# Patient Record
Sex: Female | Born: 1945 | Race: White | Hispanic: No
Health system: Southern US, Community
[De-identification: ages and names within clinical notes are randomized; demographics above are authoritative.]

---

## 2007-11-04 ENCOUNTER — Encounter: Admission: RE | Admit: 2007-11-04 | Discharge: 2007-11-04 | Payer: Self-pay | Admitting: Neurosurgery

## 2008-01-10 ENCOUNTER — Inpatient Hospital Stay (HOSPITAL_COMMUNITY): Admission: RE | Admit: 2008-01-10 | Discharge: 2008-01-19 | Payer: Self-pay | Admitting: Neurosurgery

## 2008-01-13 ENCOUNTER — Ambulatory Visit: Payer: Self-pay | Admitting: Physical Medicine & Rehabilitation

## 2008-02-16 ENCOUNTER — Encounter: Admission: RE | Admit: 2008-02-16 | Discharge: 2008-02-16 | Payer: Self-pay | Admitting: Neurosurgery

## 2008-04-17 ENCOUNTER — Encounter: Admission: RE | Admit: 2008-04-17 | Discharge: 2008-04-17 | Payer: Self-pay | Admitting: Neurosurgery

## 2009-08-23 IMAGING — CR DG LUMBAR SPINE 2-3V
4 series · 4 of 4 positions shown · non-contrast
Comparison: [HOSPITAL] at [HOSPITAL] lumbar myelogram
and post myelogram lumbar spine CT.

CLINICAL DATA: Thoracolumbar fusion.  Low back pain.

LUMBAR SPINE - 2-3 VIEW

[t l-spine a.p. (1 of 2)]
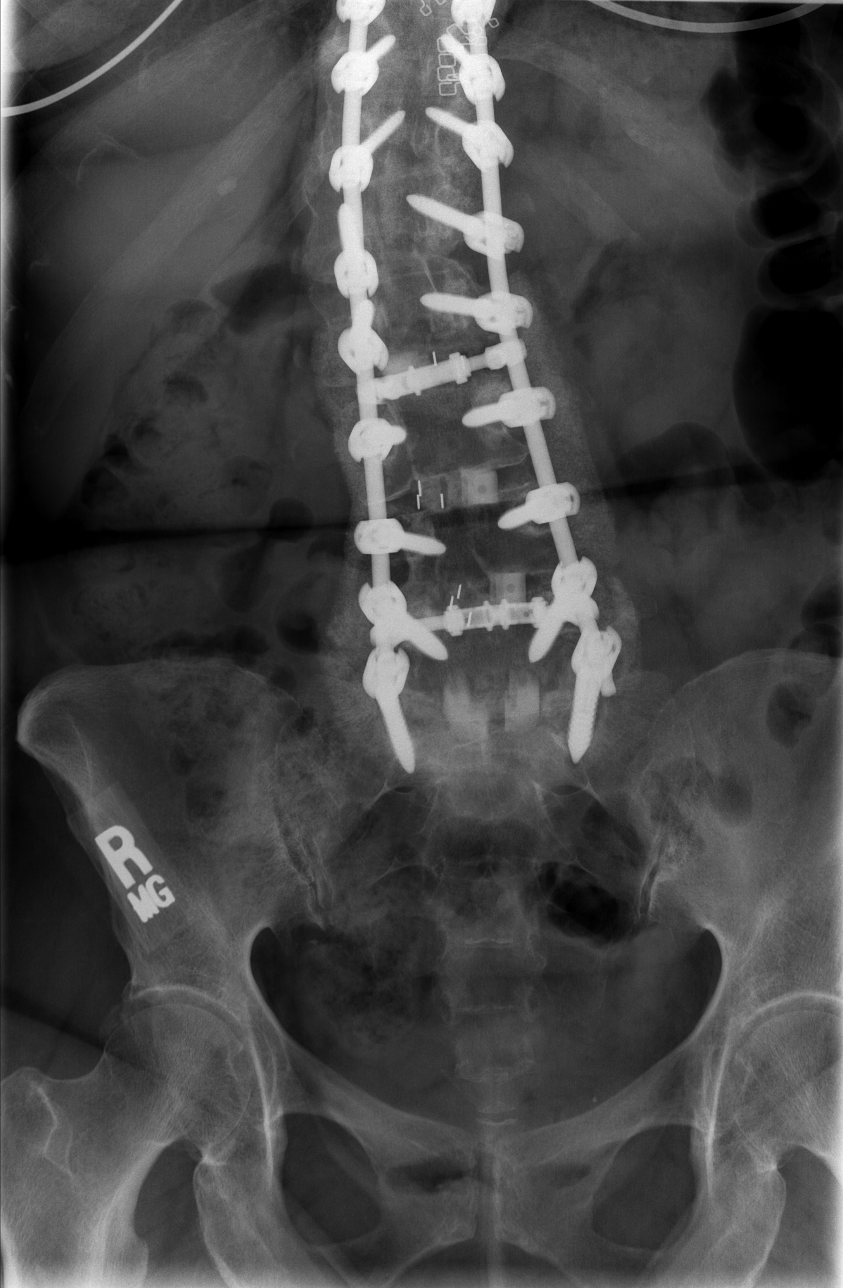

[t l-spine a.p. (2 of 2)]
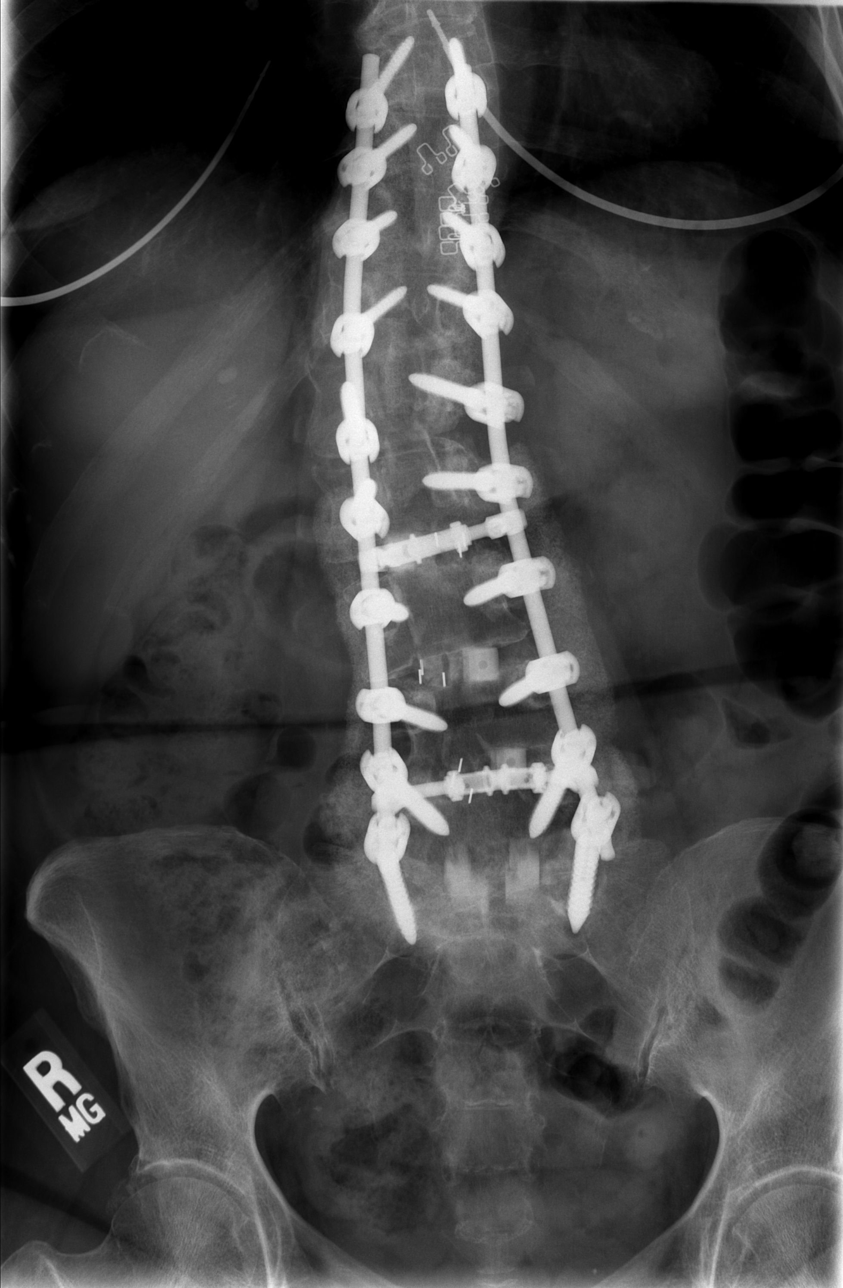

[t l-spine lat (1 of 2)]
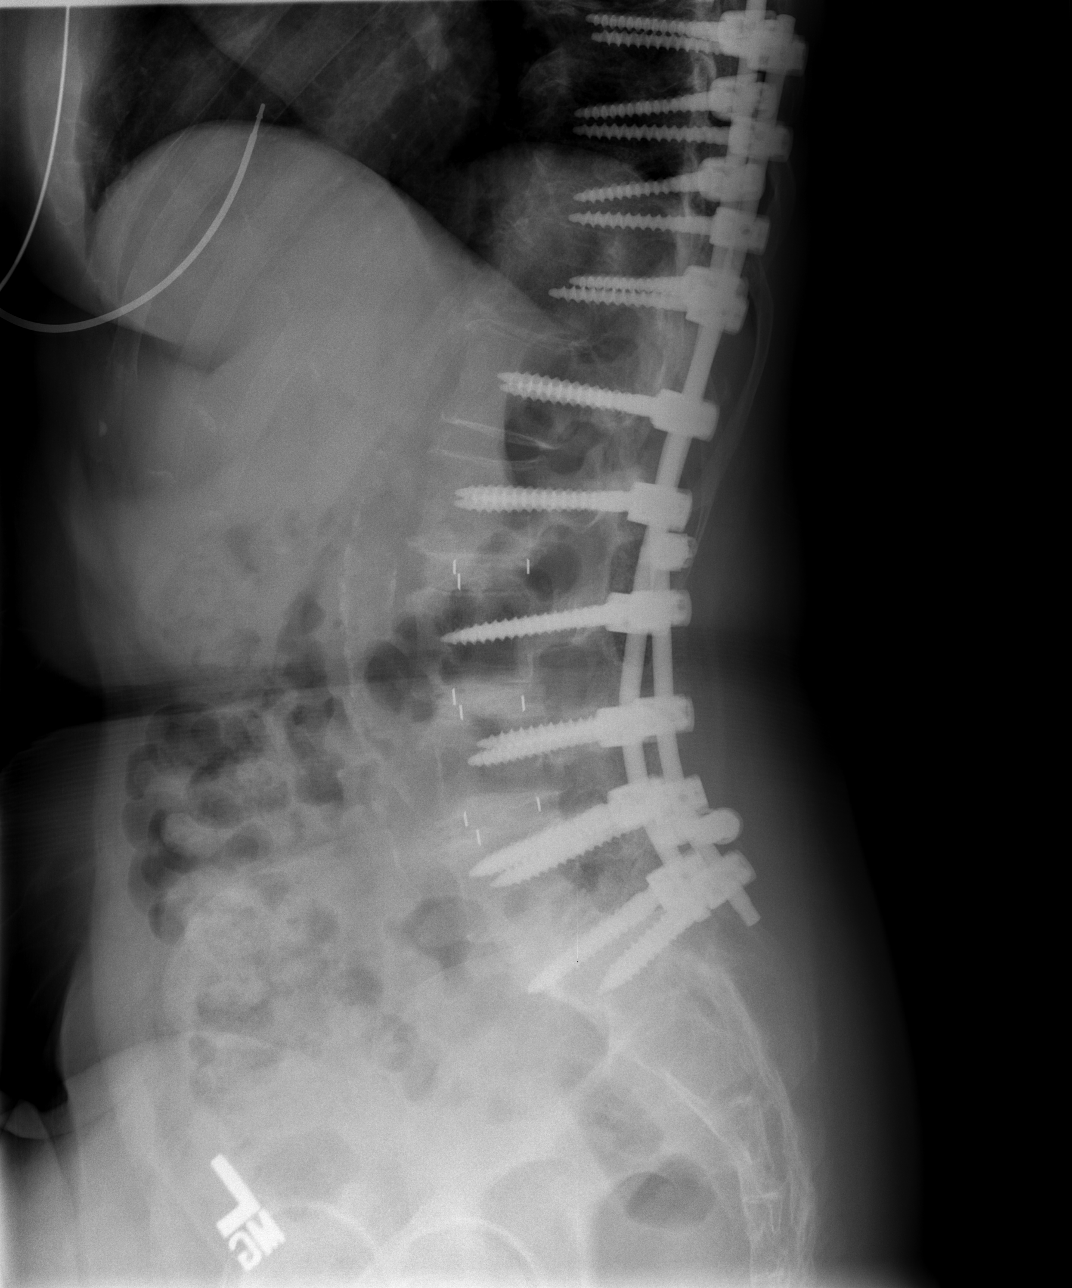

[t l-spine lat (2 of 2)]
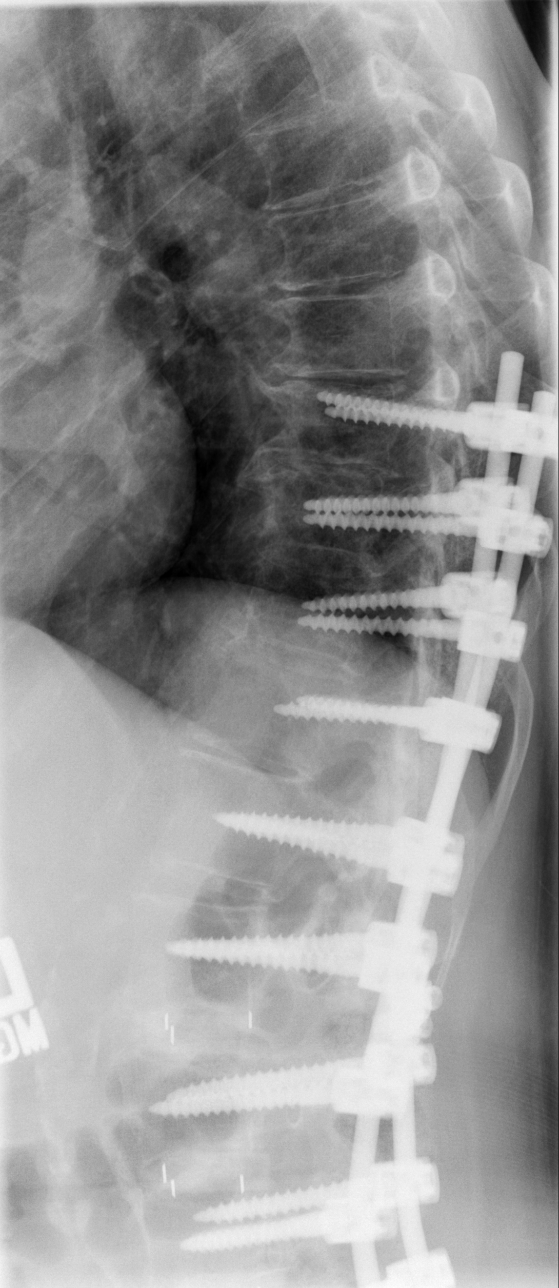

[4 of 4 positions shown; findings below may reference images not displayed]

FINDINGS: Interval posterior fusion transpedicular screws/fixation
hardware from T9 through S1 visualized with transpedicular screws
in satisfactory position without surrounding osteolysis/loosening.
Interval posterior laminectomy with interbody bone plug fusion L2-3
through L5-S1 seen.  No change in dextroscoliosis thoracolumbar
spine junction with current posterior vertebral alignment
maintained.  Posterior osseous fusion L2-S1 is also visualized and
appears satisfactory.  No change in slight to moderate degenerative
disc vertebral changes at the mid to inferior dorsal spine.
Moderate atheromatous vascular calcification with normal caliber
abdominal aorta is seen.  No other new acute findings visualized.
IMPRESSION: 1.  Interval posterior fusion fixation hardware from T9-S1.
2.  Posterior laminectomy fusion L2-3 through L5-S1.
3.  Current normal posterior vertebral alignment.
4.  Stable scoliosis and degenerative changes mid to inferior
dorsal spine with moderate atheromatous vascular calcification
normal caliber abdominal aorta.

## 2010-06-24 NOTE — Op Note (Signed)
NAMEMARLY, SCHULD                ACCOUNT NO.:  [id number]   MEDICAL RECORD NO.:  [id number]          PATIENT TYPE:  INP   LOCATION:  3102                         FACILITY:  [facility]   PHYSICIAN:  Kathaleen Maser. Pool, M.D.    DATE OF BIRTH:  06-07-1945   DATE OF PROCEDURE:  01/10/2008  DATE OF DISCHARGE:                               OPERATIVE REPORT   PREOPERATIVE DIAGNOSIS:  Thoracolumbar scoliosis with stenosis.   POSTOPERATIVE DIAGNOSIS:  Thoracolumbar scoliosis with stenosis.   PROCEDURES NAME:  1. L2-3, L3-4, L4-5, and L5-S1 decompressive laminectomy with      foraminotomies, more than would be required for simple interbody      fusion alone.  2. L2-3, L3-4, L4-5, and L5-S1 posterior lumbar interbody fusion      utilizing tangent interbody allograft wedge, Telamon interbody PEEK      cage, and local autografting.  3. T9 through S1 posterolateral arthrodesis utilizing segmental      pedicle screw instrumentation and local autografting and bone graft      extender.   SURGEON:  Kathaleen Maser. Pool, MD   ASSISTANT:  Tia Alert, MD   ANESTHESIA:  General endotracheal.   INDICATIONS:  Ms. Riese is a 65 year old female with a history of  intractable back pain, becoming increasingly disabling.  She has  bilateral lower extremities symptoms, right greater than left.  She has  failed all conservative management.  Workup demonstrates evidence of a  decompensating thoracolumbar scoliosis with the apex of her curve at L2.  She also has a grade 2 L5-S1 degenerative spondylolisthesis.  We  discussed the options available for management.  The patient decided to  proceed with a multilevel decompression and fusion with instrumentation  extended up to T9.   OPERATIVE NOTE:  The patient was brought to the operating room, placed  on the operating table in a supine position.  After adequate level of  anesthesia was achieved, the patient was placed prone onto a Wilson  frame.  Appropriately padded  the patient's lumbar regions, prepped and  draped sterilely.  A #10 blade was used to make a linear incision  extending from T9 down to the sacrum.  This was carried down sharply and  then subperiosteal dissection was performed exposing the lamina and  facet joints of T9 through S1.  Deep self-retaining retractor was  placed.  Intraoperative fluoroscopy was used, levels were confirmed.  Decompressive laminectomy was then performed using Leksell rongeurs,  Kerrison rongeurs, and high-speed drill to remove the entire lamina of  L2, 3, 4, and 5 as well as the inferior facets of 2, 3, 4, and 5  bilaterally, superior facets of L3, 4, 5, and S1 bilaterally.  Ligament  flavum was elevated and resected in a piecemeal fashion using Kerrison  rongeurs.  Underlying thecal sac was then identified.  A wide  decompressive foraminotomies were then performed along the course of the  exiting L2, 3, 4, 5, and S1 nerve roots.  Epidural venous plexus was  coagulated and cut.  Interbody fusion was then performed at L2-3, L3-4,  L4-5, and  L5-S1 in the following manner.  Bilateral diskectomies were  performed.  Disk space was then sequentially dilated up to 8 mm.  With  the distractor on one side, then thecal sac and nerve roots protected on  the other side.  Disk space was then reamed and then cut with 8 mm  tangent instrument.  Soft tissues were removed from the interspace.  An  8 x 26 mm tangent wedge was then packed to place, recessed approximately  2 mm from the posterior cortical margin.  Distractors were removed from  the patient's contralateral side.  Disk space once again reamed and then  cut with 8-mm tangent instrument.  Soft tissues were removed from the  interspace.  Morselized autograft was packed into the interspace.  An 8  x 22 mm Telamon cage packed with morselized autograft and then packed  into the interspace and recessed approximately 2 mm from the posterior  cortical margin up a level above.   All 4 levels were done without  complication.  Pedicle screw instrumentation was then performed.  Under  fluoroscopic guidance by first drilling a pilot hole under fluoroscopic  guidance at a point determined by surface landmarks and intraoperative  fluoroscopy.  Each pilot hole was then probed using pedicle awl, each  pedicle rod was tapped with a screw tap, and found to be solid as a  bone.  At T9, T10, T11, and T12, 4.75 radius screws were placed  bilaterally.  At L1, 2, 3, and 4, 5.75 screws were placed bilaterally.  At L5, 6.75 screws were placed bilaterally, and at S1, 6.75 screw was  placed on the right and a 7.75 screw was placed on the left.  All screws  were found to be well positioned and solid within the bone.  A titanium  rod was then cut and contoured over the screw heads.  It was then placed  in the screw heads.  Locking caps were then placed over the screw heads.  Locking caps were then engaged in the construct under compression.  Transverse connectors were placed.  Transverse processes, facet joints,  and lamina in the thoracic spine were then decorticated with a high-  speed drill.  Morselized autograft mixed with MASTERGRAFT strips were  then applied for later fusion.  Gelfoam was placed topically for  hemostasis in the epidural space.  Medium Hemovac drain was left in the  interspace.  Wounds were then closed in layers of Vicryl suture.  Steri-  Strips and sterile dressing were applied.  There were no intraoperative  complications.  The patient was well and she returns to recovery room  postoperatively.           ______________________________  Kathaleen Maser Pool, M.D.     HAP/MEDQ  D:  01/10/2008  T:  01/11/2008  Job:  [id number]

## 2010-06-27 NOTE — Discharge Summary (Signed)
NAMELAVELL, RIDINGS NO.:  [id number]   MEDICAL RECORD NO.:  [id number]          PATIENT TYPE:  INP   LOCATION:  3009                         FACILITY:  [facility]   PHYSICIAN:  Kathaleen Maser. Pool, M.D.    DATE OF BIRTH:  [birth year]   DATE OF ADMISSION:  01/10/2008  DATE OF DISCHARGE:  01/19/2008                               DISCHARGE SUMMARY   FINAL DIAGNOSIS:  Thoracolumbar scoliosis with stenosis and instability.   HISTORY OF PRESENT ILLNESS:  Ms. Pfefferkorn is a 65 year old female with  history of marked back pain and lower extremity discomfort, failing  conservative management.  Workup demonstrates evidence of a  destabilizing idiopathic thoracolumbar scoliosis with resultant  segmental instability and stenosis.  The patient presents now for  multilevel decompression and fusion with instrumentation.   HOSPITAL COURSE:  The patient was taken to the operating room when an  uncomplicated T9 to S1 decompression and fusion was performed.  Postoperatively, the patient awakened with intact neurological function.  She had significant incisional pain.  She required both autologous and  non-autologous blood transfusion secondary to acute blood loss anemia.  The patient hemodynamically was stable.  Urine output was good.  She was  gradually mobilized with the aid of physical and occupational therapy as  pain would allow.  Her mobility gradually improved.  During  hospitalization, the plan had been for her to have a brief stay in the  Inpatient Rehabilitation Unit.  Unfortunately, insurance would not get  back with [place] with regard to approving this stay.  This delayed her  hospital stay unnecessarily and detriment to her.  While in the  hospital, she continued to receive occupational and physical therapy.  When her insurance company did finally returned a call, they determined  that she could be safely discharged home.  The patient will be  discharged home with home physical and  occupational therapy.   CONDITION ON DISCHARGE:  Improved.   DISCHARGE DISPOSITION:  The patient will be discharged home on all her  previous home medications.  She is to take Percocet and Valium as needed  for pain and spasms respectively.  Discharge disposition in 1 week in my  office.           ______________________________  Kathaleen Maser. Pool, M.D.     HAP/MEDQ  D:  02/21/2008  T:  02/22/2008  Job:  [id number]

## 2010-11-11 LAB — CROSSMATCH: Antibody Screen: NEGATIVE

## 2010-11-11 LAB — COMPREHENSIVE METABOLIC PANEL
AST: 21 U/L (ref 0–37)
Albumin: 3.9 g/dL (ref 3.5–5.2)
Alkaline Phosphatase: 75 U/L (ref 39–117)
CO2: 27 mEq/L (ref 19–32)
Chloride: 105 mEq/L (ref 96–112)
GFR calc Af Amer: >60 mL/min (ref >60)
Glucose, Bld: 105 mg/dL — ABNORMAL HIGH (ref 70–99)
Potassium: 4 mEq/L (ref 3.5–5.1)

## 2010-11-11 LAB — DIFFERENTIAL
Eosinophils Relative: 2 % (ref 0–5)
Lymphocytes Relative: 25 % (ref 12–46)
Lymphs Abs: 2.1 10*3/uL (ref 0.7–4.0)

## 2010-11-11 LAB — CBC
Hemoglobin: 9.9 g/dL — ABNORMAL LOW (ref 12.0–15.0)
MCV: 87.8 fL (ref 78.0–100.0)
RBC: 3.42 MIL/uL — ABNORMAL LOW (ref 3.87–5.11)

## 2010-11-14 LAB — POCT I-STAT 4, (NA,K, GLUC, HGB,HCT)
HCT: 23 % — ABNORMAL LOW (ref 36.0–46.0)
Hemoglobin: 5.1 g/dL — CL (ref 12.0–15.0)
Hemoglobin: 7.8 g/dL — CL (ref 12.0–15.0)
Potassium: 4 mEq/L (ref 3.5–5.1)
Sodium: 137 mEq/L (ref 135–145)

## 2010-11-14 LAB — BASIC METABOLIC PANEL
CO2: 22 mEq/L (ref 19–32)
Calcium: 6.8 mg/dL — ABNORMAL LOW (ref 8.4–10.5)
Chloride: 112 mEq/L (ref 96–112)
GFR calc Af Amer: >60 mL/min (ref >60)
Glucose, Bld: 185 mg/dL — ABNORMAL HIGH (ref 70–99)
Potassium: 4 mEq/L (ref 3.5–5.1)
Sodium: 137 mEq/L (ref 135–145)

## 2010-11-14 LAB — CBC
HCT: 24.5 % — ABNORMAL LOW (ref 36.0–46.0)
HCT: 30.6 % — ABNORMAL LOW (ref 36.0–46.0)
Hemoglobin: 8.2 g/dL — ABNORMAL LOW (ref 12.0–15.0)
MCHC: 33.3 g/dL (ref 30.0–36.0)
MCV: 87.9 fL (ref 78.0–100.0)
MCV: 88.5 fL (ref 78.0–100.0)
RBC: 2.77 MIL/uL — ABNORMAL LOW (ref 3.87–5.11)
RBC: 3.48 MIL/uL — ABNORMAL LOW (ref 3.87–5.11)
WBC: 16.1 10*3/uL — ABNORMAL HIGH (ref 4.0–10.5)
WBC: 17.4 10*3/uL — ABNORMAL HIGH (ref 4.0–10.5)

## 2010-11-14 LAB — COMPREHENSIVE METABOLIC PANEL
CO2: 26 mEq/L (ref 19–32)
Calcium: 7.6 mg/dL — ABNORMAL LOW (ref 8.4–10.5)
Creatinine, Ser: 0.85 mg/dL (ref 0.4–1.2)
Sodium: 136 mEq/L (ref 135–145)
Total Bilirubin: 0.6 mg/dL (ref 0.3–1.2)

## 2015-02-15 DIAGNOSIS — I6523 Occlusion and stenosis of bilateral carotid arteries: Secondary | ICD-10-CM | POA: Diagnosis not present

## 2015-03-30 DIAGNOSIS — E782 Mixed hyperlipidemia: Secondary | ICD-10-CM | POA: Diagnosis not present

## 2015-03-30 DIAGNOSIS — I10 Essential (primary) hypertension: Secondary | ICD-10-CM | POA: Diagnosis not present

## 2015-04-04 DIAGNOSIS — N183 Chronic kidney disease, stage 3 (moderate): Secondary | ICD-10-CM | POA: Diagnosis not present

## 2015-04-04 DIAGNOSIS — I129 Hypertensive chronic kidney disease with stage 1 through stage 4 chronic kidney disease, or unspecified chronic kidney disease: Secondary | ICD-10-CM | POA: Diagnosis not present

## 2015-04-04 DIAGNOSIS — E782 Mixed hyperlipidemia: Secondary | ICD-10-CM | POA: Diagnosis not present

## 2015-04-04 DIAGNOSIS — F17200 Nicotine dependence, unspecified, uncomplicated: Secondary | ICD-10-CM | POA: Diagnosis not present

## 2015-04-04 DIAGNOSIS — I6523 Occlusion and stenosis of bilateral carotid arteries: Secondary | ICD-10-CM | POA: Diagnosis not present

## 2015-04-04 DIAGNOSIS — E876 Hypokalemia: Secondary | ICD-10-CM | POA: Diagnosis not present

## 2015-05-20 DIAGNOSIS — I129 Hypertensive chronic kidney disease with stage 1 through stage 4 chronic kidney disease, or unspecified chronic kidney disease: Secondary | ICD-10-CM | POA: Diagnosis not present

## 2015-05-22 DIAGNOSIS — N183 Chronic kidney disease, stage 3 (moderate): Secondary | ICD-10-CM | POA: Diagnosis not present

## 2015-05-22 DIAGNOSIS — I129 Hypertensive chronic kidney disease with stage 1 through stage 4 chronic kidney disease, or unspecified chronic kidney disease: Secondary | ICD-10-CM | POA: Diagnosis not present

## 2015-06-19 DIAGNOSIS — I10 Essential (primary) hypertension: Secondary | ICD-10-CM | POA: Diagnosis not present

## 2015-06-21 DIAGNOSIS — M150 Primary generalized (osteo)arthritis: Secondary | ICD-10-CM | POA: Diagnosis not present

## 2015-06-21 DIAGNOSIS — N183 Chronic kidney disease, stage 3 (moderate): Secondary | ICD-10-CM | POA: Diagnosis not present

## 2015-06-21 DIAGNOSIS — I129 Hypertensive chronic kidney disease with stage 1 through stage 4 chronic kidney disease, or unspecified chronic kidney disease: Secondary | ICD-10-CM | POA: Diagnosis not present

## 2015-10-03 DIAGNOSIS — F339 Major depressive disorder, recurrent, unspecified: Secondary | ICD-10-CM | POA: Diagnosis not present

## 2015-10-03 DIAGNOSIS — M549 Dorsalgia, unspecified: Secondary | ICD-10-CM | POA: Diagnosis not present

## 2015-10-03 DIAGNOSIS — H539 Unspecified visual disturbance: Secondary | ICD-10-CM | POA: Diagnosis not present

## 2015-10-03 DIAGNOSIS — Z0000 Encounter for general adult medical examination without abnormal findings: Secondary | ICD-10-CM | POA: Diagnosis not present

## 2015-10-03 DIAGNOSIS — E782 Mixed hyperlipidemia: Secondary | ICD-10-CM | POA: Diagnosis not present

## 2015-10-03 DIAGNOSIS — I6523 Occlusion and stenosis of bilateral carotid arteries: Secondary | ICD-10-CM | POA: Diagnosis not present

## 2015-10-03 DIAGNOSIS — I10 Essential (primary) hypertension: Secondary | ICD-10-CM | POA: Diagnosis not present

## 2015-10-03 DIAGNOSIS — G8929 Other chronic pain: Secondary | ICD-10-CM | POA: Diagnosis not present

## 2015-10-03 DIAGNOSIS — F17210 Nicotine dependence, cigarettes, uncomplicated: Secondary | ICD-10-CM | POA: Diagnosis not present

## 2015-10-03 DIAGNOSIS — E876 Hypokalemia: Secondary | ICD-10-CM | POA: Diagnosis not present

## 2015-10-03 DIAGNOSIS — M8580 Other specified disorders of bone density and structure, unspecified site: Secondary | ICD-10-CM | POA: Diagnosis not present

## 2015-10-03 DIAGNOSIS — Z1239 Encounter for other screening for malignant neoplasm of breast: Secondary | ICD-10-CM | POA: Diagnosis not present

## 2015-10-17 DIAGNOSIS — H04123 Dry eye syndrome of bilateral lacrimal glands: Secondary | ICD-10-CM | POA: Diagnosis not present

## 2015-10-17 DIAGNOSIS — H2513 Age-related nuclear cataract, bilateral: Secondary | ICD-10-CM | POA: Diagnosis not present

## 2015-10-17 DIAGNOSIS — D3131 Benign neoplasm of right choroid: Secondary | ICD-10-CM | POA: Diagnosis not present

## 2015-10-17 DIAGNOSIS — H43393 Other vitreous opacities, bilateral: Secondary | ICD-10-CM | POA: Diagnosis not present

## 2015-10-17 DIAGNOSIS — H52223 Regular astigmatism, bilateral: Secondary | ICD-10-CM | POA: Diagnosis not present

## 2015-10-17 DIAGNOSIS — H5203 Hypermetropia, bilateral: Secondary | ICD-10-CM | POA: Diagnosis not present

## 2015-10-17 DIAGNOSIS — H524 Presbyopia: Secondary | ICD-10-CM | POA: Diagnosis not present

## 2015-11-08 DIAGNOSIS — Z780 Asymptomatic menopausal state: Secondary | ICD-10-CM | POA: Diagnosis not present

## 2015-11-08 DIAGNOSIS — M85832 Other specified disorders of bone density and structure, left forearm: Secondary | ICD-10-CM | POA: Diagnosis not present

## 2015-11-08 DIAGNOSIS — Z1231 Encounter for screening mammogram for malignant neoplasm of breast: Secondary | ICD-10-CM | POA: Diagnosis not present

## 2016-05-23 DIAGNOSIS — I10 Essential (primary) hypertension: Secondary | ICD-10-CM | POA: Diagnosis not present

## 2016-05-28 DIAGNOSIS — E782 Mixed hyperlipidemia: Secondary | ICD-10-CM | POA: Diagnosis not present

## 2016-05-28 DIAGNOSIS — N183 Chronic kidney disease, stage 3 (moderate): Secondary | ICD-10-CM | POA: Diagnosis not present

## 2016-05-28 DIAGNOSIS — I129 Hypertensive chronic kidney disease with stage 1 through stage 4 chronic kidney disease, or unspecified chronic kidney disease: Secondary | ICD-10-CM | POA: Diagnosis not present

## 2016-05-30 DIAGNOSIS — E782 Mixed hyperlipidemia: Secondary | ICD-10-CM | POA: Diagnosis not present

## 2016-10-21 DIAGNOSIS — H52223 Regular astigmatism, bilateral: Secondary | ICD-10-CM | POA: Diagnosis not present

## 2016-10-21 DIAGNOSIS — H5203 Hypermetropia, bilateral: Secondary | ICD-10-CM | POA: Diagnosis not present

## 2016-10-21 DIAGNOSIS — H524 Presbyopia: Secondary | ICD-10-CM | POA: Diagnosis not present

## 2016-10-30 DIAGNOSIS — I10 Essential (primary) hypertension: Secondary | ICD-10-CM | POA: Diagnosis not present

## 2016-11-24 DIAGNOSIS — E782 Mixed hyperlipidemia: Secondary | ICD-10-CM | POA: Diagnosis not present

## 2016-11-24 DIAGNOSIS — Z1159 Encounter for screening for other viral diseases: Secondary | ICD-10-CM | POA: Diagnosis not present

## 2016-11-24 DIAGNOSIS — N183 Chronic kidney disease, stage 3 (moderate): Secondary | ICD-10-CM | POA: Diagnosis not present

## 2016-11-24 DIAGNOSIS — Z23 Encounter for immunization: Secondary | ICD-10-CM | POA: Diagnosis not present

## 2016-11-24 DIAGNOSIS — Z0000 Encounter for general adult medical examination without abnormal findings: Secondary | ICD-10-CM | POA: Diagnosis not present

## 2016-11-24 DIAGNOSIS — E876 Hypokalemia: Secondary | ICD-10-CM | POA: Diagnosis not present

## 2016-11-24 DIAGNOSIS — I129 Hypertensive chronic kidney disease with stage 1 through stage 4 chronic kidney disease, or unspecified chronic kidney disease: Secondary | ICD-10-CM | POA: Diagnosis not present

## 2016-11-24 DIAGNOSIS — I6523 Occlusion and stenosis of bilateral carotid arteries: Secondary | ICD-10-CM | POA: Diagnosis not present

## 2016-12-16 DIAGNOSIS — Z1231 Encounter for screening mammogram for malignant neoplasm of breast: Secondary | ICD-10-CM | POA: Diagnosis not present

## 2016-12-23 DIAGNOSIS — H40013 Open angle with borderline findings, low risk, bilateral: Secondary | ICD-10-CM | POA: Diagnosis not present

## 2016-12-23 DIAGNOSIS — H2513 Age-related nuclear cataract, bilateral: Secondary | ICD-10-CM | POA: Diagnosis not present

## 2016-12-23 DIAGNOSIS — H04123 Dry eye syndrome of bilateral lacrimal glands: Secondary | ICD-10-CM | POA: Diagnosis not present

## 2016-12-23 DIAGNOSIS — H43393 Other vitreous opacities, bilateral: Secondary | ICD-10-CM | POA: Diagnosis not present

## 2016-12-23 DIAGNOSIS — D3131 Benign neoplasm of right choroid: Secondary | ICD-10-CM | POA: Diagnosis not present

## 2017-01-27 DIAGNOSIS — E876 Hypokalemia: Secondary | ICD-10-CM | POA: Diagnosis not present

## 2017-02-18 DIAGNOSIS — E876 Hypokalemia: Secondary | ICD-10-CM | POA: Diagnosis not present

## 2017-05-15 DIAGNOSIS — E782 Mixed hyperlipidemia: Secondary | ICD-10-CM | POA: Diagnosis not present

## 2017-05-26 DIAGNOSIS — I129 Hypertensive chronic kidney disease with stage 1 through stage 4 chronic kidney disease, or unspecified chronic kidney disease: Secondary | ICD-10-CM | POA: Diagnosis not present

## 2017-05-26 DIAGNOSIS — N183 Chronic kidney disease, stage 3 (moderate): Secondary | ICD-10-CM | POA: Diagnosis not present

## 2017-05-26 DIAGNOSIS — E782 Mixed hyperlipidemia: Secondary | ICD-10-CM | POA: Diagnosis not present

## 2017-05-26 DIAGNOSIS — M25511 Pain in right shoulder: Secondary | ICD-10-CM | POA: Diagnosis not present

## 2017-06-22 DIAGNOSIS — I10 Essential (primary) hypertension: Secondary | ICD-10-CM | POA: Diagnosis not present

## 2017-10-20 DIAGNOSIS — H52223 Regular astigmatism, bilateral: Secondary | ICD-10-CM | POA: Diagnosis not present

## 2017-10-20 DIAGNOSIS — H524 Presbyopia: Secondary | ICD-10-CM | POA: Diagnosis not present

## 2017-10-20 DIAGNOSIS — H5203 Hypermetropia, bilateral: Secondary | ICD-10-CM | POA: Diagnosis not present

## 2017-12-01 DIAGNOSIS — M8580 Other specified disorders of bone density and structure, unspecified site: Secondary | ICD-10-CM | POA: Diagnosis not present

## 2017-12-01 DIAGNOSIS — E782 Mixed hyperlipidemia: Secondary | ICD-10-CM | POA: Diagnosis not present

## 2017-12-01 DIAGNOSIS — M150 Primary generalized (osteo)arthritis: Secondary | ICD-10-CM | POA: Diagnosis not present

## 2017-12-01 DIAGNOSIS — G8929 Other chronic pain: Secondary | ICD-10-CM | POA: Diagnosis not present

## 2017-12-01 DIAGNOSIS — Z0000 Encounter for general adult medical examination without abnormal findings: Secondary | ICD-10-CM | POA: Diagnosis not present

## 2017-12-01 DIAGNOSIS — I129 Hypertensive chronic kidney disease with stage 1 through stage 4 chronic kidney disease, or unspecified chronic kidney disease: Secondary | ICD-10-CM | POA: Diagnosis not present

## 2017-12-01 DIAGNOSIS — F339 Major depressive disorder, recurrent, unspecified: Secondary | ICD-10-CM | POA: Diagnosis not present

## 2017-12-01 DIAGNOSIS — F17200 Nicotine dependence, unspecified, uncomplicated: Secondary | ICD-10-CM | POA: Diagnosis not present

## 2017-12-01 DIAGNOSIS — I6523 Occlusion and stenosis of bilateral carotid arteries: Secondary | ICD-10-CM | POA: Diagnosis not present

## 2017-12-01 DIAGNOSIS — E876 Hypokalemia: Secondary | ICD-10-CM | POA: Diagnosis not present

## 2017-12-01 DIAGNOSIS — N183 Chronic kidney disease, stage 3 (moderate): Secondary | ICD-10-CM | POA: Diagnosis not present

## 2017-12-01 DIAGNOSIS — Z23 Encounter for immunization: Secondary | ICD-10-CM | POA: Diagnosis not present

## 2017-12-08 DIAGNOSIS — I6523 Occlusion and stenosis of bilateral carotid arteries: Secondary | ICD-10-CM | POA: Diagnosis not present

## 2017-12-14 DIAGNOSIS — R3121 Asymptomatic microscopic hematuria: Secondary | ICD-10-CM | POA: Diagnosis not present

## 2017-12-14 DIAGNOSIS — R319 Hematuria, unspecified: Secondary | ICD-10-CM | POA: Diagnosis not present

## 2017-12-21 DIAGNOSIS — Z1231 Encounter for screening mammogram for malignant neoplasm of breast: Secondary | ICD-10-CM | POA: Diagnosis not present

## 2018-01-19 DIAGNOSIS — M25552 Pain in left hip: Secondary | ICD-10-CM | POA: Diagnosis not present

## 2018-01-19 DIAGNOSIS — M1612 Unilateral primary osteoarthritis, left hip: Secondary | ICD-10-CM | POA: Diagnosis not present

## 2018-01-19 DIAGNOSIS — M545 Low back pain: Secondary | ICD-10-CM | POA: Diagnosis not present

## 2018-01-19 DIAGNOSIS — M5416 Radiculopathy, lumbar region: Secondary | ICD-10-CM | POA: Diagnosis not present

## 2018-02-10 DIAGNOSIS — R3121 Asymptomatic microscopic hematuria: Secondary | ICD-10-CM | POA: Diagnosis not present

## 2018-02-17 DIAGNOSIS — N281 Cyst of kidney, acquired: Secondary | ICD-10-CM | POA: Diagnosis not present

## 2018-02-17 DIAGNOSIS — R3129 Other microscopic hematuria: Secondary | ICD-10-CM | POA: Diagnosis not present

## 2018-02-17 DIAGNOSIS — R3121 Asymptomatic microscopic hematuria: Secondary | ICD-10-CM | POA: Diagnosis not present

## 2018-02-17 DIAGNOSIS — K7689 Other specified diseases of liver: Secondary | ICD-10-CM | POA: Diagnosis not present

## 2018-02-17 DIAGNOSIS — I700 Atherosclerosis of aorta: Secondary | ICD-10-CM | POA: Diagnosis not present

## 2018-02-17 DIAGNOSIS — K5730 Diverticulosis of large intestine without perforation or abscess without bleeding: Secondary | ICD-10-CM | POA: Diagnosis not present

## 2018-02-17 DIAGNOSIS — Z9089 Acquired absence of other organs: Secondary | ICD-10-CM | POA: Diagnosis not present

## 2018-02-17 DIAGNOSIS — K838 Other specified diseases of biliary tract: Secondary | ICD-10-CM | POA: Diagnosis not present

## 2018-06-30 DIAGNOSIS — I10 Essential (primary) hypertension: Secondary | ICD-10-CM | POA: Diagnosis not present

## 2018-06-30 DIAGNOSIS — D72829 Elevated white blood cell count, unspecified: Secondary | ICD-10-CM | POA: Diagnosis not present

## 2018-06-30 DIAGNOSIS — E782 Mixed hyperlipidemia: Secondary | ICD-10-CM | POA: Diagnosis not present

## 2018-07-06 DIAGNOSIS — N183 Chronic kidney disease, stage 3 (moderate): Secondary | ICD-10-CM | POA: Diagnosis not present

## 2018-07-06 DIAGNOSIS — E782 Mixed hyperlipidemia: Secondary | ICD-10-CM | POA: Diagnosis not present

## 2018-07-06 DIAGNOSIS — I129 Hypertensive chronic kidney disease with stage 1 through stage 4 chronic kidney disease, or unspecified chronic kidney disease: Secondary | ICD-10-CM | POA: Diagnosis not present

## 2018-12-06 DIAGNOSIS — I10 Essential (primary) hypertension: Secondary | ICD-10-CM | POA: Diagnosis not present

## 2018-12-06 DIAGNOSIS — M8580 Other specified disorders of bone density and structure, unspecified site: Secondary | ICD-10-CM | POA: Diagnosis not present

## 2018-12-06 DIAGNOSIS — N814 Uterovaginal prolapse, unspecified: Secondary | ICD-10-CM | POA: Diagnosis not present

## 2018-12-06 DIAGNOSIS — M8949 Other hypertrophic osteoarthropathy, multiple sites: Secondary | ICD-10-CM | POA: Diagnosis not present

## 2018-12-06 DIAGNOSIS — I129 Hypertensive chronic kidney disease with stage 1 through stage 4 chronic kidney disease, or unspecified chronic kidney disease: Secondary | ICD-10-CM | POA: Diagnosis not present

## 2018-12-06 DIAGNOSIS — I6523 Occlusion and stenosis of bilateral carotid arteries: Secondary | ICD-10-CM | POA: Diagnosis not present

## 2018-12-06 DIAGNOSIS — N183 Chronic kidney disease, stage 3 unspecified: Secondary | ICD-10-CM | POA: Diagnosis not present

## 2018-12-06 DIAGNOSIS — F17200 Nicotine dependence, unspecified, uncomplicated: Secondary | ICD-10-CM | POA: Diagnosis not present

## 2018-12-06 DIAGNOSIS — F339 Major depressive disorder, recurrent, unspecified: Secondary | ICD-10-CM | POA: Diagnosis not present

## 2018-12-06 DIAGNOSIS — E782 Mixed hyperlipidemia: Secondary | ICD-10-CM | POA: Diagnosis not present

## 2018-12-06 DIAGNOSIS — Z0000 Encounter for general adult medical examination without abnormal findings: Secondary | ICD-10-CM | POA: Diagnosis not present

## 2018-12-27 DIAGNOSIS — Z01419 Encounter for gynecological examination (general) (routine) without abnormal findings: Secondary | ICD-10-CM | POA: Diagnosis not present

## 2018-12-27 DIAGNOSIS — N812 Incomplete uterovaginal prolapse: Secondary | ICD-10-CM | POA: Diagnosis not present

## 2018-12-27 DIAGNOSIS — N814 Uterovaginal prolapse, unspecified: Secondary | ICD-10-CM | POA: Diagnosis not present

## 2019-03-01 ENCOUNTER — Ambulatory Visit: Payer: PPO | Attending: Internal Medicine

## 2019-03-01 DIAGNOSIS — Z23 Encounter for immunization: Secondary | ICD-10-CM | POA: Insufficient documentation

## 2019-03-01 NOTE — Progress Notes (Signed)
   Covid-19 Vaccination Clinic  Name:  Kathy Wheeler    MRN: [id number] DOB: [birth year]  03/01/2019  [name] was observed post Covid-19 immunization for 15 minutes without incidence. She was provided with Vaccine Information Sheet and instruction to access the V-Safe system.   [name] was instructed to call 911 with any severe reactions post vaccine: Marland Kitchen Difficulty breathing  . Swelling of your face and throat  . A fast heartbeat  . A bad rash all over your body  . Dizziness and weakness    Immunizations Administered    Name Date Dose VIS Date Route   Pfizer COVID-19 Vaccine 03/01/2019  3:51 PM 0.3 mL 01/20/2019 Intramuscular   Manufacturer: [place]   Lot: [id number]   [place]: [id number]

## 2019-03-20 ENCOUNTER — Ambulatory Visit: Payer: PPO | Attending: Internal Medicine

## 2019-03-20 DIAGNOSIS — Z23 Encounter for immunization: Secondary | ICD-10-CM | POA: Insufficient documentation

## 2019-03-20 NOTE — Progress Notes (Signed)
   Covid-19 Vaccination Clinic  Name:  Kathy Wheeler    MRN: [id number] DOB: 1946-01-18  03/20/2019  [name] was observed post Covid-19 immunization for 15 minutes without incidence. She was provided with Vaccine Information Sheet and instruction to access the V-Safe system.   [name] was instructed to call 911 with any severe reactions post vaccine: Marland Kitchen Difficulty breathing  . Swelling of your face and throat  . A fast heartbeat  . A bad rash all over your body  . Dizziness and weakness    Immunizations Administered    Name Date Dose VIS Date Route   Pfizer COVID-19 Vaccine 03/20/2019  8:46 AM 0.3 mL 01/20/2019 Intramuscular   Manufacturer: [place]   Lot: [id number]   [place]: [id number]

## 2019-04-08 ENCOUNTER — Ambulatory Visit: Payer: Self-pay

## 2019-04-13 DIAGNOSIS — H90A21 Sensorineural hearing loss, unilateral, right ear, with restricted hearing on the contralateral side: Secondary | ICD-10-CM | POA: Diagnosis not present

## 2019-05-11 DIAGNOSIS — H90A32 Mixed conductive and sensorineural hearing loss, unilateral, left ear with restricted hearing on the contralateral side: Secondary | ICD-10-CM | POA: Diagnosis not present

## 2019-06-01 DIAGNOSIS — I10 Essential (primary) hypertension: Secondary | ICD-10-CM | POA: Diagnosis not present

## 2019-06-06 DIAGNOSIS — I129 Hypertensive chronic kidney disease with stage 1 through stage 4 chronic kidney disease, or unspecified chronic kidney disease: Secondary | ICD-10-CM | POA: Diagnosis not present

## 2019-06-06 DIAGNOSIS — R5383 Other fatigue: Secondary | ICD-10-CM | POA: Diagnosis not present

## 2019-06-06 DIAGNOSIS — F3342 Major depressive disorder, recurrent, in full remission: Secondary | ICD-10-CM | POA: Diagnosis not present

## 2019-06-06 DIAGNOSIS — E782 Mixed hyperlipidemia: Secondary | ICD-10-CM | POA: Diagnosis not present

## 2019-06-06 DIAGNOSIS — F339 Major depressive disorder, recurrent, unspecified: Secondary | ICD-10-CM | POA: Diagnosis not present

## 2019-06-06 DIAGNOSIS — M5137 Other intervertebral disc degeneration, lumbosacral region: Secondary | ICD-10-CM | POA: Diagnosis not present

## 2019-06-06 DIAGNOSIS — R42 Dizziness and giddiness: Secondary | ICD-10-CM | POA: Diagnosis not present

## 2019-06-06 DIAGNOSIS — N183 Chronic kidney disease, stage 3 unspecified: Secondary | ICD-10-CM | POA: Diagnosis not present

## 2019-06-06 DIAGNOSIS — M8949 Other hypertrophic osteoarthropathy, multiple sites: Secondary | ICD-10-CM | POA: Diagnosis not present

## 2019-06-08 DIAGNOSIS — H90A21 Sensorineural hearing loss, unilateral, right ear, with restricted hearing on the contralateral side: Secondary | ICD-10-CM | POA: Diagnosis not present

## 2019-06-10 DIAGNOSIS — E782 Mixed hyperlipidemia: Secondary | ICD-10-CM | POA: Diagnosis not present

## 2019-06-10 DIAGNOSIS — R5383 Other fatigue: Secondary | ICD-10-CM | POA: Diagnosis not present

## 2019-06-14 DIAGNOSIS — E785 Hyperlipidemia, unspecified: Secondary | ICD-10-CM | POA: Diagnosis not present

## 2019-06-14 DIAGNOSIS — R42 Dizziness and giddiness: Secondary | ICD-10-CM | POA: Diagnosis not present

## 2019-06-14 DIAGNOSIS — I10 Essential (primary) hypertension: Secondary | ICD-10-CM | POA: Diagnosis not present

## 2019-07-15 DIAGNOSIS — E538 Deficiency of other specified B group vitamins: Secondary | ICD-10-CM | POA: Diagnosis not present

## 2019-07-15 DIAGNOSIS — I129 Hypertensive chronic kidney disease with stage 1 through stage 4 chronic kidney disease, or unspecified chronic kidney disease: Secondary | ICD-10-CM | POA: Diagnosis not present

## 2019-07-18 DIAGNOSIS — E538 Deficiency of other specified B group vitamins: Secondary | ICD-10-CM | POA: Diagnosis not present

## 2019-12-08 DIAGNOSIS — Z23 Encounter for immunization: Secondary | ICD-10-CM | POA: Diagnosis not present

## 2019-12-08 DIAGNOSIS — Z0000 Encounter for general adult medical examination without abnormal findings: Secondary | ICD-10-CM | POA: Diagnosis not present

## 2019-12-08 DIAGNOSIS — F3342 Major depressive disorder, recurrent, in full remission: Secondary | ICD-10-CM | POA: Diagnosis not present

## 2019-12-08 DIAGNOSIS — E782 Mixed hyperlipidemia: Secondary | ICD-10-CM | POA: Diagnosis not present

## 2019-12-08 DIAGNOSIS — I129 Hypertensive chronic kidney disease with stage 1 through stage 4 chronic kidney disease, or unspecified chronic kidney disease: Secondary | ICD-10-CM | POA: Diagnosis not present

## 2019-12-08 DIAGNOSIS — N183 Chronic kidney disease, stage 3 unspecified: Secondary | ICD-10-CM | POA: Diagnosis not present

## 2019-12-08 DIAGNOSIS — E876 Hypokalemia: Secondary | ICD-10-CM | POA: Diagnosis not present

## 2019-12-08 DIAGNOSIS — I6523 Occlusion and stenosis of bilateral carotid arteries: Secondary | ICD-10-CM | POA: Diagnosis not present

## 2019-12-08 DIAGNOSIS — M8580 Other specified disorders of bone density and structure, unspecified site: Secondary | ICD-10-CM | POA: Diagnosis not present

## 2019-12-08 DIAGNOSIS — F17210 Nicotine dependence, cigarettes, uncomplicated: Secondary | ICD-10-CM | POA: Diagnosis not present

## 2020-06-07 DIAGNOSIS — I129 Hypertensive chronic kidney disease with stage 1 through stage 4 chronic kidney disease, or unspecified chronic kidney disease: Secondary | ICD-10-CM | POA: Diagnosis not present

## 2020-06-07 DIAGNOSIS — E782 Mixed hyperlipidemia: Secondary | ICD-10-CM | POA: Diagnosis not present

## 2020-06-07 DIAGNOSIS — I10 Essential (primary) hypertension: Secondary | ICD-10-CM | POA: Diagnosis not present

## 2020-06-07 DIAGNOSIS — N183 Chronic kidney disease, stage 3 unspecified: Secondary | ICD-10-CM | POA: Diagnosis not present

## 2020-06-07 DIAGNOSIS — I6529 Occlusion and stenosis of unspecified carotid artery: Secondary | ICD-10-CM | POA: Diagnosis not present

## 2020-12-11 DIAGNOSIS — I129 Hypertensive chronic kidney disease with stage 1 through stage 4 chronic kidney disease, or unspecified chronic kidney disease: Secondary | ICD-10-CM | POA: Diagnosis not present

## 2020-12-11 DIAGNOSIS — Z7982 Long term (current) use of aspirin: Secondary | ICD-10-CM | POA: Diagnosis not present

## 2020-12-11 DIAGNOSIS — Z803 Family history of malignant neoplasm of breast: Secondary | ICD-10-CM | POA: Diagnosis not present

## 2020-12-11 DIAGNOSIS — I6523 Occlusion and stenosis of bilateral carotid arteries: Secondary | ICD-10-CM | POA: Diagnosis not present

## 2020-12-11 DIAGNOSIS — G8929 Other chronic pain: Secondary | ICD-10-CM | POA: Diagnosis not present

## 2020-12-11 DIAGNOSIS — Z0000 Encounter for general adult medical examination without abnormal findings: Secondary | ICD-10-CM | POA: Diagnosis not present

## 2020-12-11 DIAGNOSIS — I10 Essential (primary) hypertension: Secondary | ICD-10-CM | POA: Diagnosis not present

## 2020-12-11 DIAGNOSIS — Z538 Procedure and treatment not carried out for other reasons: Secondary | ICD-10-CM | POA: Diagnosis not present

## 2020-12-11 DIAGNOSIS — M8580 Other specified disorders of bone density and structure, unspecified site: Secondary | ICD-10-CM | POA: Diagnosis not present

## 2020-12-11 DIAGNOSIS — F17210 Nicotine dependence, cigarettes, uncomplicated: Secondary | ICD-10-CM | POA: Diagnosis not present

## 2020-12-11 DIAGNOSIS — E876 Hypokalemia: Secondary | ICD-10-CM | POA: Diagnosis not present

## 2020-12-11 DIAGNOSIS — Z0001 Encounter for general adult medical examination with abnormal findings: Secondary | ICD-10-CM | POA: Diagnosis not present

## 2020-12-11 DIAGNOSIS — E538 Deficiency of other specified B group vitamins: Secondary | ICD-10-CM | POA: Diagnosis not present

## 2020-12-11 DIAGNOSIS — M159 Polyosteoarthritis, unspecified: Secondary | ICD-10-CM | POA: Diagnosis not present

## 2020-12-11 DIAGNOSIS — Z974 Presence of external hearing-aid: Secondary | ICD-10-CM | POA: Diagnosis not present

## 2020-12-11 DIAGNOSIS — Z9989 Dependence on other enabling machines and devices: Secondary | ICD-10-CM | POA: Diagnosis not present

## 2020-12-11 DIAGNOSIS — N183 Chronic kidney disease, stage 3 unspecified: Secondary | ICD-10-CM | POA: Diagnosis not present

## 2020-12-11 DIAGNOSIS — Z79899 Other long term (current) drug therapy: Secondary | ICD-10-CM | POA: Diagnosis not present

## 2020-12-11 DIAGNOSIS — E782 Mixed hyperlipidemia: Secondary | ICD-10-CM | POA: Diagnosis not present

## 2020-12-11 DIAGNOSIS — F17200 Nicotine dependence, unspecified, uncomplicated: Secondary | ICD-10-CM | POA: Diagnosis not present

## 2020-12-18 DIAGNOSIS — H52223 Regular astigmatism, bilateral: Secondary | ICD-10-CM | POA: Diagnosis not present

## 2021-01-24 DIAGNOSIS — I10 Essential (primary) hypertension: Secondary | ICD-10-CM | POA: Diagnosis not present

## 2021-01-24 DIAGNOSIS — R7989 Other specified abnormal findings of blood chemistry: Secondary | ICD-10-CM | POA: Diagnosis not present

## 2021-01-24 DIAGNOSIS — I129 Hypertensive chronic kidney disease with stage 1 through stage 4 chronic kidney disease, or unspecified chronic kidney disease: Secondary | ICD-10-CM | POA: Diagnosis not present
